# Patient Record
Sex: Female | Born: 1950 | Race: Black or African American | Hispanic: No | Smoking: Never smoker
Health system: Southern US, Community
[De-identification: ages and names within clinical notes are randomized; demographics above are authoritative.]

## PROBLEM LIST (undated history)

## (undated) DIAGNOSIS — G43909 Migraine, unspecified, not intractable, without status migrainosus: Secondary | ICD-10-CM

## (undated) DIAGNOSIS — R4020 Unspecified coma: Secondary | ICD-10-CM

## (undated) DIAGNOSIS — I10 Essential (primary) hypertension: Secondary | ICD-10-CM

## (undated) DIAGNOSIS — F32A Depression, unspecified: Secondary | ICD-10-CM

## (undated) DIAGNOSIS — F419 Anxiety disorder, unspecified: Secondary | ICD-10-CM

## (undated) HISTORY — PX: ABDOMINAL HYSTERECTOMY: SHX81

## (undated) HISTORY — DX: Unspecified coma: R40.20

---

## 2016-06-03 DIAGNOSIS — H5210 Myopia, unspecified eye: Secondary | ICD-10-CM | POA: Diagnosis not present

## 2016-06-03 DIAGNOSIS — H524 Presbyopia: Secondary | ICD-10-CM | POA: Diagnosis not present

## 2016-07-01 ENCOUNTER — Encounter (INDEPENDENT_AMBULATORY_CARE_PROVIDER_SITE_OTHER): Payer: Self-pay | Admitting: Ophthalmology

## 2017-02-23 DIAGNOSIS — H524 Presbyopia: Secondary | ICD-10-CM | POA: Diagnosis not present

## 2017-02-23 DIAGNOSIS — H5203 Hypermetropia, bilateral: Secondary | ICD-10-CM | POA: Diagnosis not present

## 2017-02-23 DIAGNOSIS — H52209 Unspecified astigmatism, unspecified eye: Secondary | ICD-10-CM | POA: Diagnosis not present

## 2017-02-27 ENCOUNTER — Other Ambulatory Visit: Payer: Self-pay | Admitting: Family Medicine

## 2017-02-27 ENCOUNTER — Other Ambulatory Visit (HOSPITAL_COMMUNITY)
Admission: RE | Admit: 2017-02-27 | Discharge: 2017-02-27 | Disposition: A | Discharge status: Home / Self Care | Payer: Medicare HMO | Source: Ambulatory Visit | Attending: Family Medicine | Admitting: Family Medicine

## 2017-02-27 DIAGNOSIS — L290 Pruritus ani: Secondary | ICD-10-CM | POA: Diagnosis not present

## 2017-02-27 DIAGNOSIS — R7303 Prediabetes: Secondary | ICD-10-CM | POA: Diagnosis not present

## 2017-02-27 DIAGNOSIS — Z0000 Encounter for general adult medical examination without abnormal findings: Secondary | ICD-10-CM | POA: Diagnosis not present

## 2017-02-27 DIAGNOSIS — E785 Hyperlipidemia, unspecified: Secondary | ICD-10-CM | POA: Diagnosis not present

## 2017-02-27 DIAGNOSIS — N6459 Other signs and symptoms in breast: Secondary | ICD-10-CM | POA: Diagnosis not present

## 2017-02-27 DIAGNOSIS — Z124 Encounter for screening for malignant neoplasm of cervix: Secondary | ICD-10-CM | POA: Insufficient documentation

## 2017-02-27 DIAGNOSIS — K219 Gastro-esophageal reflux disease without esophagitis: Secondary | ICD-10-CM | POA: Diagnosis not present

## 2017-02-27 DIAGNOSIS — F419 Anxiety disorder, unspecified: Secondary | ICD-10-CM | POA: Diagnosis not present

## 2017-02-27 DIAGNOSIS — I10 Essential (primary) hypertension: Secondary | ICD-10-CM | POA: Diagnosis not present

## 2017-02-27 DIAGNOSIS — Z23 Encounter for immunization: Secondary | ICD-10-CM | POA: Diagnosis not present

## 2017-03-01 LAB — CYTOLOGY - PAP: Diagnosis: NEGATIVE

## 2018-08-22 ENCOUNTER — Other Ambulatory Visit: Payer: Self-pay | Admitting: Family Medicine

## 2018-08-22 DIAGNOSIS — Z1231 Encounter for screening mammogram for malignant neoplasm of breast: Secondary | ICD-10-CM

## 2018-10-01 ENCOUNTER — Encounter: Payer: Self-pay | Admitting: Radiology

## 2018-10-01 ENCOUNTER — Ambulatory Visit
Admission: RE | Admit: 2018-10-01 | Discharge: 2018-10-01 | Disposition: A | Discharge status: Home / Self Care | Payer: Medicare Other | Source: Ambulatory Visit | Attending: Family Medicine | Admitting: Family Medicine

## 2018-10-01 DIAGNOSIS — Z1231 Encounter for screening mammogram for malignant neoplasm of breast: Secondary | ICD-10-CM

## 2020-05-28 ENCOUNTER — Other Ambulatory Visit: Payer: Self-pay | Admitting: Family Medicine

## 2020-05-28 DIAGNOSIS — Z1231 Encounter for screening mammogram for malignant neoplasm of breast: Secondary | ICD-10-CM

## 2020-05-28 DIAGNOSIS — E2839 Other primary ovarian failure: Secondary | ICD-10-CM

## 2020-08-26 ENCOUNTER — Other Ambulatory Visit: Payer: Self-pay

## 2020-08-26 ENCOUNTER — Ambulatory Visit
Admission: RE | Admit: 2020-08-26 | Discharge: 2020-08-26 | Disposition: A | Discharge status: Home / Self Care | Payer: Medicare Other | Source: Ambulatory Visit | Attending: Family Medicine | Admitting: Family Medicine

## 2020-08-26 DIAGNOSIS — E2839 Other primary ovarian failure: Secondary | ICD-10-CM

## 2020-08-26 DIAGNOSIS — Z1231 Encounter for screening mammogram for malignant neoplasm of breast: Secondary | ICD-10-CM

## 2020-11-27 DIAGNOSIS — R7303 Prediabetes: Secondary | ICD-10-CM | POA: Diagnosis not present

## 2020-11-27 DIAGNOSIS — R69 Illness, unspecified: Secondary | ICD-10-CM | POA: Diagnosis not present

## 2020-11-27 DIAGNOSIS — E7800 Pure hypercholesterolemia, unspecified: Secondary | ICD-10-CM | POA: Diagnosis not present

## 2020-11-27 DIAGNOSIS — I10 Essential (primary) hypertension: Secondary | ICD-10-CM | POA: Diagnosis not present

## 2020-12-09 DIAGNOSIS — Z20822 Contact with and (suspected) exposure to covid-19: Secondary | ICD-10-CM | POA: Diagnosis not present

## 2020-12-16 DIAGNOSIS — Z20822 Contact with and (suspected) exposure to covid-19: Secondary | ICD-10-CM | POA: Diagnosis not present

## 2020-12-22 DIAGNOSIS — Z20822 Contact with and (suspected) exposure to covid-19: Secondary | ICD-10-CM | POA: Diagnosis not present

## 2020-12-23 DIAGNOSIS — R69 Illness, unspecified: Secondary | ICD-10-CM | POA: Diagnosis not present

## 2020-12-23 DIAGNOSIS — H547 Unspecified visual loss: Secondary | ICD-10-CM | POA: Diagnosis not present

## 2020-12-23 DIAGNOSIS — K219 Gastro-esophageal reflux disease without esophagitis: Secondary | ICD-10-CM | POA: Diagnosis not present

## 2020-12-23 DIAGNOSIS — I10 Essential (primary) hypertension: Secondary | ICD-10-CM | POA: Diagnosis not present

## 2020-12-23 DIAGNOSIS — E785 Hyperlipidemia, unspecified: Secondary | ICD-10-CM | POA: Diagnosis not present

## 2020-12-23 DIAGNOSIS — F419 Anxiety disorder, unspecified: Secondary | ICD-10-CM | POA: Diagnosis not present

## 2020-12-23 DIAGNOSIS — Z6841 Body Mass Index (BMI) 40.0 and over, adult: Secondary | ICD-10-CM | POA: Diagnosis not present

## 2020-12-23 DIAGNOSIS — R32 Unspecified urinary incontinence: Secondary | ICD-10-CM | POA: Diagnosis not present

## 2020-12-29 DIAGNOSIS — Z20822 Contact with and (suspected) exposure to covid-19: Secondary | ICD-10-CM | POA: Diagnosis not present

## 2021-02-24 ENCOUNTER — Other Ambulatory Visit: Payer: Self-pay | Admitting: Gastroenterology

## 2021-02-24 DIAGNOSIS — R1013 Epigastric pain: Secondary | ICD-10-CM | POA: Diagnosis not present

## 2021-02-24 DIAGNOSIS — R1310 Dysphagia, unspecified: Secondary | ICD-10-CM | POA: Diagnosis not present

## 2021-02-24 DIAGNOSIS — Z86010 Personal history of colonic polyps: Secondary | ICD-10-CM | POA: Diagnosis not present

## 2021-03-17 ENCOUNTER — Other Ambulatory Visit: Payer: Medicare Other

## 2021-04-19 DIAGNOSIS — Z20822 Contact with and (suspected) exposure to covid-19: Secondary | ICD-10-CM | POA: Diagnosis not present

## 2021-04-20 DIAGNOSIS — Z20822 Contact with and (suspected) exposure to covid-19: Secondary | ICD-10-CM | POA: Diagnosis not present

## 2021-04-26 DIAGNOSIS — Z20822 Contact with and (suspected) exposure to covid-19: Secondary | ICD-10-CM | POA: Diagnosis not present

## 2021-05-03 DIAGNOSIS — G4700 Insomnia, unspecified: Secondary | ICD-10-CM | POA: Diagnosis not present

## 2021-05-03 DIAGNOSIS — I10 Essential (primary) hypertension: Secondary | ICD-10-CM | POA: Diagnosis not present

## 2021-05-03 DIAGNOSIS — K219 Gastro-esophageal reflux disease without esophagitis: Secondary | ICD-10-CM | POA: Diagnosis not present

## 2021-05-03 DIAGNOSIS — E7800 Pure hypercholesterolemia, unspecified: Secondary | ICD-10-CM | POA: Diagnosis not present

## 2021-05-04 DIAGNOSIS — Z20822 Contact with and (suspected) exposure to covid-19: Secondary | ICD-10-CM | POA: Diagnosis not present

## 2021-05-11 DIAGNOSIS — Z20822 Contact with and (suspected) exposure to covid-19: Secondary | ICD-10-CM | POA: Diagnosis not present

## 2021-05-20 DIAGNOSIS — Z20822 Contact with and (suspected) exposure to covid-19: Secondary | ICD-10-CM | POA: Diagnosis not present

## 2021-05-25 DIAGNOSIS — Z20822 Contact with and (suspected) exposure to covid-19: Secondary | ICD-10-CM | POA: Diagnosis not present

## 2021-05-28 DIAGNOSIS — N1831 Chronic kidney disease, stage 3a: Secondary | ICD-10-CM | POA: Diagnosis not present

## 2021-05-28 DIAGNOSIS — E7800 Pure hypercholesterolemia, unspecified: Secondary | ICD-10-CM | POA: Diagnosis not present

## 2021-05-28 DIAGNOSIS — G4700 Insomnia, unspecified: Secondary | ICD-10-CM | POA: Diagnosis not present

## 2021-05-28 DIAGNOSIS — I10 Essential (primary) hypertension: Secondary | ICD-10-CM | POA: Diagnosis not present

## 2021-05-28 DIAGNOSIS — K219 Gastro-esophageal reflux disease without esophagitis: Secondary | ICD-10-CM | POA: Diagnosis not present

## 2021-06-01 DIAGNOSIS — Z20822 Contact with and (suspected) exposure to covid-19: Secondary | ICD-10-CM | POA: Diagnosis not present

## 2021-06-07 DIAGNOSIS — M85859 Other specified disorders of bone density and structure, unspecified thigh: Secondary | ICD-10-CM | POA: Diagnosis not present

## 2021-06-07 DIAGNOSIS — H3552 Pigmentary retinal dystrophy: Secondary | ICD-10-CM | POA: Diagnosis not present

## 2021-06-07 DIAGNOSIS — K219 Gastro-esophageal reflux disease without esophagitis: Secondary | ICD-10-CM | POA: Diagnosis not present

## 2021-06-07 DIAGNOSIS — R32 Unspecified urinary incontinence: Secondary | ICD-10-CM | POA: Diagnosis not present

## 2021-06-07 DIAGNOSIS — R69 Illness, unspecified: Secondary | ICD-10-CM | POA: Diagnosis not present

## 2021-06-07 DIAGNOSIS — E7800 Pure hypercholesterolemia, unspecified: Secondary | ICD-10-CM | POA: Diagnosis not present

## 2021-06-07 DIAGNOSIS — Z0000 Encounter for general adult medical examination without abnormal findings: Secondary | ICD-10-CM | POA: Diagnosis not present

## 2021-06-07 DIAGNOSIS — I10 Essential (primary) hypertension: Secondary | ICD-10-CM | POA: Diagnosis not present

## 2021-06-07 DIAGNOSIS — L089 Local infection of the skin and subcutaneous tissue, unspecified: Secondary | ICD-10-CM | POA: Diagnosis not present

## 2021-06-07 DIAGNOSIS — Z86010 Personal history of colonic polyps: Secondary | ICD-10-CM | POA: Diagnosis not present

## 2021-06-07 DIAGNOSIS — Z1389 Encounter for screening for other disorder: Secondary | ICD-10-CM | POA: Diagnosis not present

## 2021-06-08 DIAGNOSIS — Z20822 Contact with and (suspected) exposure to covid-19: Secondary | ICD-10-CM | POA: Diagnosis not present

## 2021-06-14 DIAGNOSIS — K5730 Diverticulosis of large intestine without perforation or abscess without bleeding: Secondary | ICD-10-CM | POA: Diagnosis not present

## 2021-06-14 DIAGNOSIS — K2930 Chronic superficial gastritis without bleeding: Secondary | ICD-10-CM | POA: Diagnosis not present

## 2021-06-14 DIAGNOSIS — Z86010 Personal history of colonic polyps: Secondary | ICD-10-CM | POA: Diagnosis not present

## 2021-06-14 DIAGNOSIS — K3189 Other diseases of stomach and duodenum: Secondary | ICD-10-CM | POA: Diagnosis not present

## 2021-06-14 DIAGNOSIS — R1310 Dysphagia, unspecified: Secondary | ICD-10-CM | POA: Diagnosis not present

## 2021-06-14 DIAGNOSIS — D123 Benign neoplasm of transverse colon: Secondary | ICD-10-CM | POA: Diagnosis not present

## 2021-06-14 DIAGNOSIS — R1013 Epigastric pain: Secondary | ICD-10-CM | POA: Diagnosis not present

## 2021-06-14 DIAGNOSIS — K219 Gastro-esophageal reflux disease without esophagitis: Secondary | ICD-10-CM | POA: Diagnosis not present

## 2021-06-14 DIAGNOSIS — K317 Polyp of stomach and duodenum: Secondary | ICD-10-CM | POA: Diagnosis not present

## 2021-06-15 DIAGNOSIS — Z20822 Contact with and (suspected) exposure to covid-19: Secondary | ICD-10-CM | POA: Diagnosis not present

## 2021-06-18 DIAGNOSIS — D123 Benign neoplasm of transverse colon: Secondary | ICD-10-CM | POA: Diagnosis not present

## 2021-06-18 DIAGNOSIS — K219 Gastro-esophageal reflux disease without esophagitis: Secondary | ICD-10-CM | POA: Diagnosis not present

## 2021-06-18 DIAGNOSIS — K2930 Chronic superficial gastritis without bleeding: Secondary | ICD-10-CM | POA: Diagnosis not present

## 2021-06-18 DIAGNOSIS — K317 Polyp of stomach and duodenum: Secondary | ICD-10-CM | POA: Diagnosis not present

## 2021-06-23 DIAGNOSIS — Z20822 Contact with and (suspected) exposure to covid-19: Secondary | ICD-10-CM | POA: Diagnosis not present

## 2021-06-28 DIAGNOSIS — I10 Essential (primary) hypertension: Secondary | ICD-10-CM | POA: Diagnosis not present

## 2021-06-28 DIAGNOSIS — K219 Gastro-esophageal reflux disease without esophagitis: Secondary | ICD-10-CM | POA: Diagnosis not present

## 2021-06-28 DIAGNOSIS — G4700 Insomnia, unspecified: Secondary | ICD-10-CM | POA: Diagnosis not present

## 2021-06-28 DIAGNOSIS — E7800 Pure hypercholesterolemia, unspecified: Secondary | ICD-10-CM | POA: Diagnosis not present

## 2021-06-28 DIAGNOSIS — N1831 Chronic kidney disease, stage 3a: Secondary | ICD-10-CM | POA: Diagnosis not present

## 2021-08-04 DIAGNOSIS — Z1152 Encounter for screening for COVID-19: Secondary | ICD-10-CM | POA: Diagnosis not present

## 2021-08-12 DIAGNOSIS — Z1152 Encounter for screening for COVID-19: Secondary | ICD-10-CM | POA: Diagnosis not present

## 2021-08-13 DIAGNOSIS — N1831 Chronic kidney disease, stage 3a: Secondary | ICD-10-CM | POA: Diagnosis not present

## 2021-08-13 DIAGNOSIS — G4700 Insomnia, unspecified: Secondary | ICD-10-CM | POA: Diagnosis not present

## 2021-08-13 DIAGNOSIS — I10 Essential (primary) hypertension: Secondary | ICD-10-CM | POA: Diagnosis not present

## 2021-08-13 DIAGNOSIS — E7800 Pure hypercholesterolemia, unspecified: Secondary | ICD-10-CM | POA: Diagnosis not present

## 2021-08-13 DIAGNOSIS — K219 Gastro-esophageal reflux disease without esophagitis: Secondary | ICD-10-CM | POA: Diagnosis not present

## 2021-08-27 DIAGNOSIS — Z1152 Encounter for screening for COVID-19: Secondary | ICD-10-CM | POA: Diagnosis not present

## 2021-09-02 DIAGNOSIS — Z1152 Encounter for screening for COVID-19: Secondary | ICD-10-CM | POA: Diagnosis not present

## 2021-09-04 DIAGNOSIS — Z1152 Encounter for screening for COVID-19: Secondary | ICD-10-CM | POA: Diagnosis not present

## 2021-09-28 DIAGNOSIS — I10 Essential (primary) hypertension: Secondary | ICD-10-CM | POA: Diagnosis not present

## 2021-09-28 DIAGNOSIS — K219 Gastro-esophageal reflux disease without esophagitis: Secondary | ICD-10-CM | POA: Diagnosis not present

## 2021-09-28 DIAGNOSIS — G4700 Insomnia, unspecified: Secondary | ICD-10-CM | POA: Diagnosis not present

## 2021-09-28 DIAGNOSIS — N1831 Chronic kidney disease, stage 3a: Secondary | ICD-10-CM | POA: Diagnosis not present

## 2021-09-28 DIAGNOSIS — E7800 Pure hypercholesterolemia, unspecified: Secondary | ICD-10-CM | POA: Diagnosis not present

## 2021-11-02 DIAGNOSIS — M1991 Primary osteoarthritis, unspecified site: Secondary | ICD-10-CM | POA: Diagnosis not present

## 2021-11-02 DIAGNOSIS — Z0000 Encounter for general adult medical examination without abnormal findings: Secondary | ICD-10-CM | POA: Diagnosis not present

## 2021-11-02 DIAGNOSIS — R7303 Prediabetes: Secondary | ICD-10-CM | POA: Diagnosis not present

## 2021-11-02 DIAGNOSIS — R69 Illness, unspecified: Secondary | ICD-10-CM | POA: Diagnosis not present

## 2021-11-02 DIAGNOSIS — I10 Essential (primary) hypertension: Secondary | ICD-10-CM | POA: Diagnosis not present

## 2021-11-02 DIAGNOSIS — R32 Unspecified urinary incontinence: Secondary | ICD-10-CM | POA: Diagnosis not present

## 2021-11-05 DIAGNOSIS — S86911A Strain of unspecified muscle(s) and tendon(s) at lower leg level, right leg, initial encounter: Secondary | ICD-10-CM | POA: Diagnosis not present

## 2021-11-05 DIAGNOSIS — S86112A Strain of other muscle(s) and tendon(s) of posterior muscle group at lower leg level, left leg, initial encounter: Secondary | ICD-10-CM | POA: Diagnosis not present

## 2021-11-05 DIAGNOSIS — W19XXXA Unspecified fall, initial encounter: Secondary | ICD-10-CM | POA: Diagnosis not present

## 2021-11-05 DIAGNOSIS — M25561 Pain in right knee: Secondary | ICD-10-CM | POA: Diagnosis not present

## 2021-12-08 DIAGNOSIS — F419 Anxiety disorder, unspecified: Secondary | ICD-10-CM | POA: Diagnosis not present

## 2021-12-08 DIAGNOSIS — M79604 Pain in right leg: Secondary | ICD-10-CM | POA: Diagnosis not present

## 2021-12-08 DIAGNOSIS — I10 Essential (primary) hypertension: Secondary | ICD-10-CM | POA: Diagnosis not present

## 2021-12-08 DIAGNOSIS — K219 Gastro-esophageal reflux disease without esophagitis: Secondary | ICD-10-CM | POA: Diagnosis not present

## 2021-12-08 DIAGNOSIS — R7303 Prediabetes: Secondary | ICD-10-CM | POA: Diagnosis not present

## 2021-12-08 DIAGNOSIS — E7800 Pure hypercholesterolemia, unspecified: Secondary | ICD-10-CM | POA: Diagnosis not present

## 2021-12-14 DIAGNOSIS — I10 Essential (primary) hypertension: Secondary | ICD-10-CM | POA: Diagnosis not present

## 2021-12-16 DIAGNOSIS — M79605 Pain in left leg: Secondary | ICD-10-CM | POA: Diagnosis not present

## 2021-12-16 DIAGNOSIS — M25561 Pain in right knee: Secondary | ICD-10-CM | POA: Diagnosis not present

## 2021-12-16 DIAGNOSIS — M25551 Pain in right hip: Secondary | ICD-10-CM | POA: Diagnosis not present

## 2021-12-19 IMAGING — MG DIGITAL SCREENING BILAT W/ TOMO W/ CAD
5 series · 6 of 17 positions shown · non-contrast
Comparison: Previous exam(s).

CLINICAL DATA: Screening.

EXAM:
DIGITAL SCREENING BILATERAL MAMMOGRAM WITH TOMO AND CAD

[L MLO synth-2D]
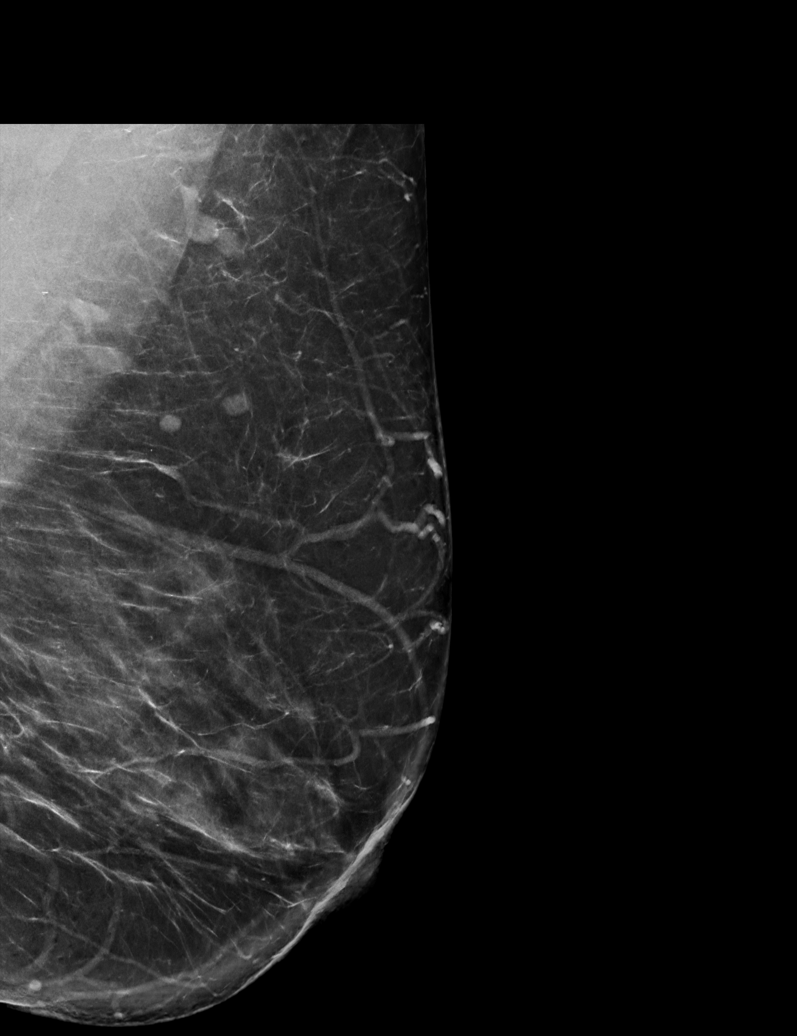

[L CC synth-2D]
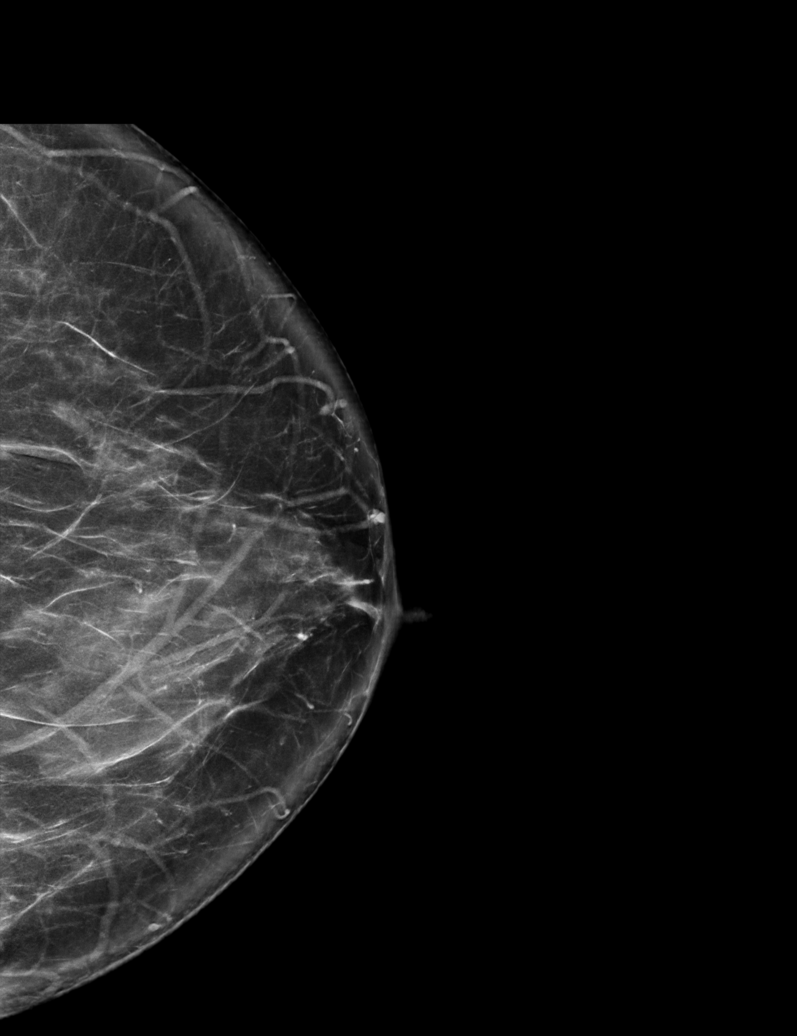

[L CC tomo · 2 of 75 frames shown]
[frame 25/75]
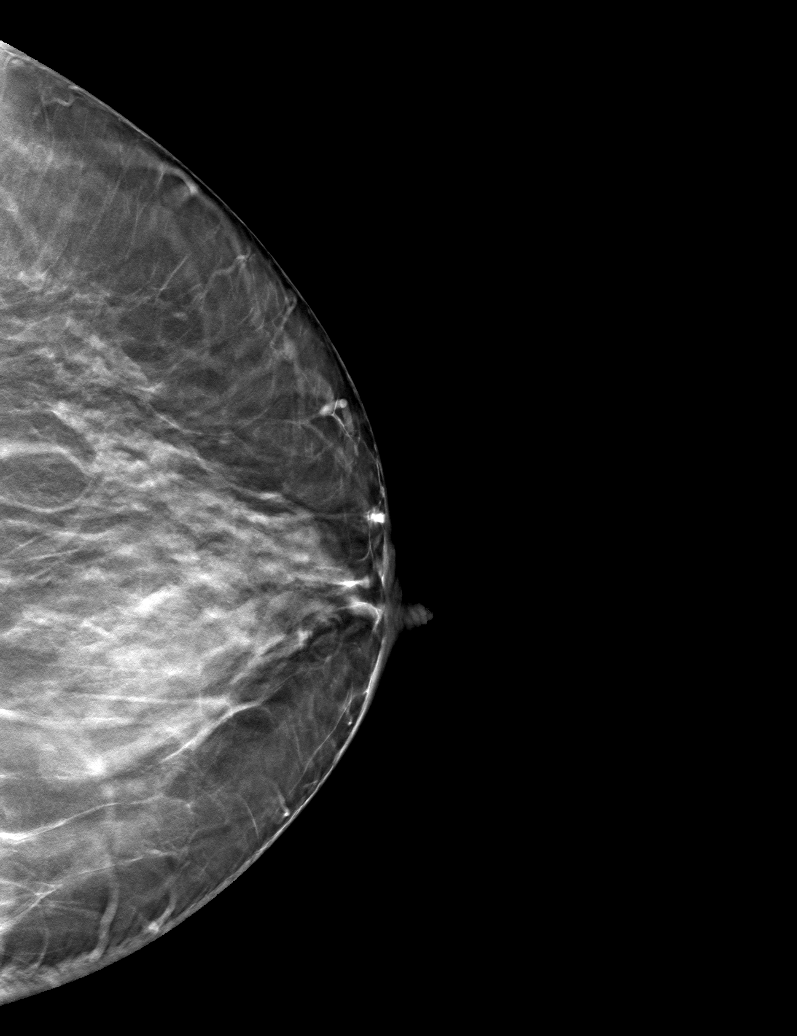
[frame 38/75]
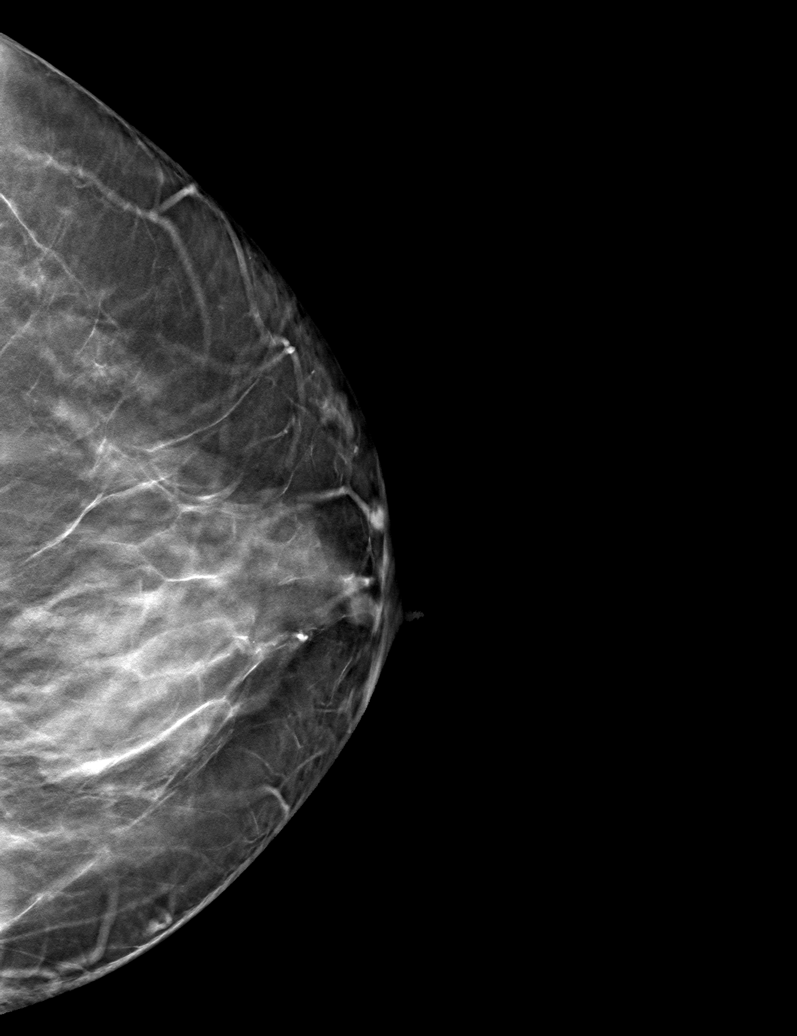

[R CC tomo · tomo slice 39/78.0]
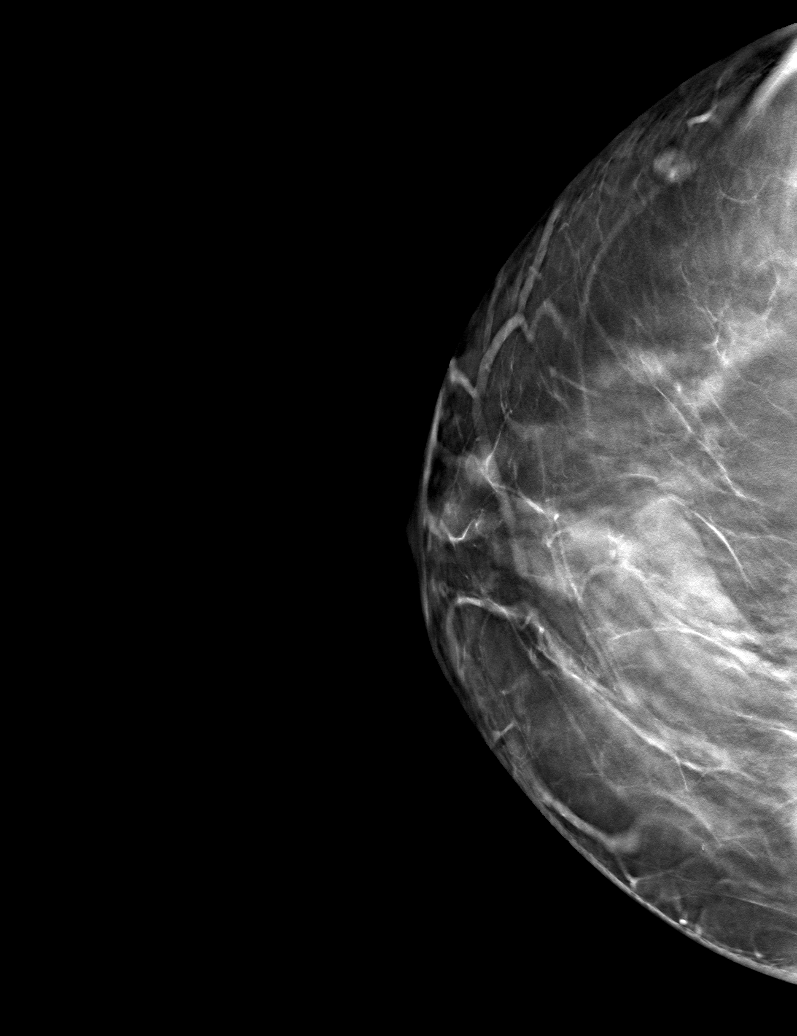

[R MLO tomo · tomo slice 43/84.0]
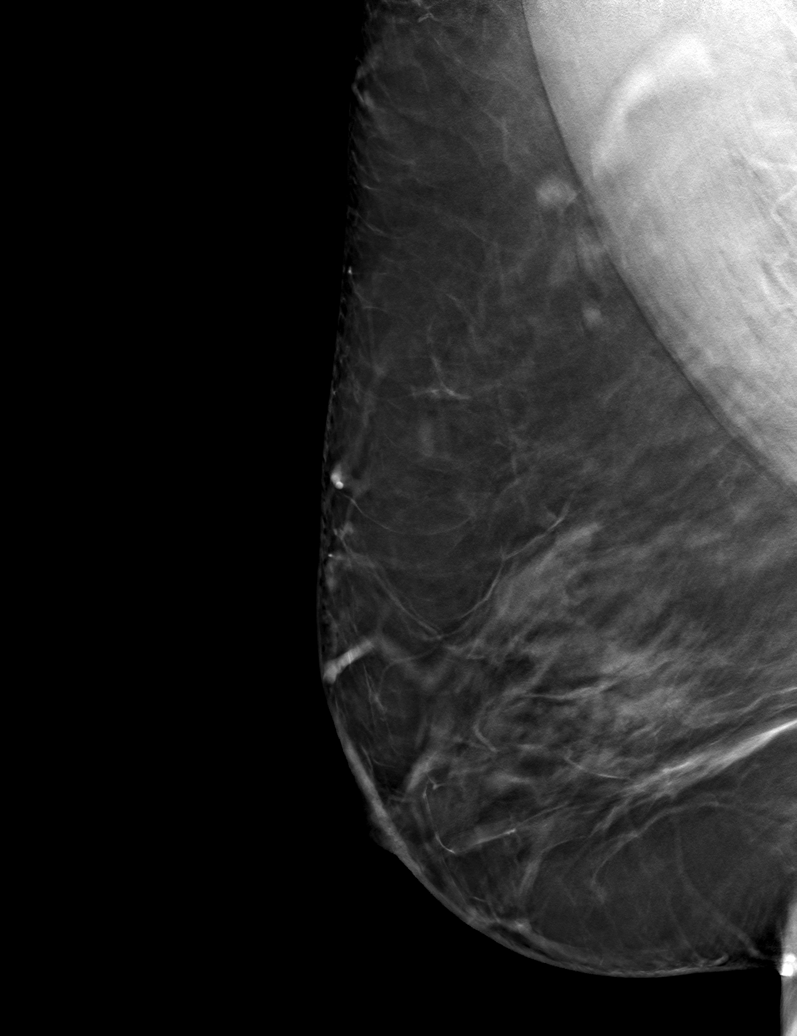

[6 of 17 positions shown; findings below may reference images not displayed]

ACR Breast Density Category c: The breast tissue is heterogeneously
dense, which may obscure small masses.
FINDINGS: There are no findings suspicious for malignancy. Images were
processed with CAD.
IMPRESSION: No mammographic evidence of malignancy. A result letter of this
screening mammogram will be mailed directly to the patient.

RECOMMENDATION:
Screening mammogram in one year. (Code:FT-U-LHB)

BI-RADS CATEGORY  1: Negative.

## 2021-12-26 DIAGNOSIS — M25561 Pain in right knee: Secondary | ICD-10-CM | POA: Diagnosis not present

## 2021-12-30 DIAGNOSIS — M25561 Pain in right knee: Secondary | ICD-10-CM | POA: Diagnosis not present

## 2022-03-04 DIAGNOSIS — R7303 Prediabetes: Secondary | ICD-10-CM | POA: Diagnosis not present

## 2022-04-06 DIAGNOSIS — R232 Flushing: Secondary | ICD-10-CM | POA: Diagnosis not present

## 2022-04-20 DIAGNOSIS — R7309 Other abnormal glucose: Secondary | ICD-10-CM | POA: Diagnosis not present

## 2022-05-06 DIAGNOSIS — R7309 Other abnormal glucose: Secondary | ICD-10-CM | POA: Diagnosis not present

## 2022-05-30 DIAGNOSIS — R7309 Other abnormal glucose: Secondary | ICD-10-CM | POA: Diagnosis not present

## 2022-06-13 ENCOUNTER — Emergency Department (HOSPITAL_COMMUNITY)
Admission: EM | Admit: 2022-06-13 | Discharge: 2022-06-13 | Disposition: A | Discharge status: Home / Self Care | Payer: Medicare Other | Attending: Emergency Medicine | Admitting: Emergency Medicine

## 2022-06-13 ENCOUNTER — Emergency Department (HOSPITAL_COMMUNITY): Payer: Medicare Other

## 2022-06-13 ENCOUNTER — Other Ambulatory Visit: Payer: Self-pay

## 2022-06-13 ENCOUNTER — Encounter (HOSPITAL_COMMUNITY): Payer: Self-pay

## 2022-06-13 DIAGNOSIS — Z79899 Other long term (current) drug therapy: Secondary | ICD-10-CM | POA: Insufficient documentation

## 2022-06-13 DIAGNOSIS — G43809 Other migraine, not intractable, without status migrainosus: Secondary | ICD-10-CM | POA: Insufficient documentation

## 2022-06-13 DIAGNOSIS — R42 Dizziness and giddiness: Secondary | ICD-10-CM | POA: Insufficient documentation

## 2022-06-13 DIAGNOSIS — H53149 Visual discomfort, unspecified: Secondary | ICD-10-CM | POA: Diagnosis not present

## 2022-06-13 DIAGNOSIS — I159 Secondary hypertension, unspecified: Secondary | ICD-10-CM | POA: Diagnosis not present

## 2022-06-13 DIAGNOSIS — R079 Chest pain, unspecified: Secondary | ICD-10-CM | POA: Diagnosis not present

## 2022-06-13 DIAGNOSIS — R519 Headache, unspecified: Secondary | ICD-10-CM

## 2022-06-13 DIAGNOSIS — H538 Other visual disturbances: Secondary | ICD-10-CM | POA: Insufficient documentation

## 2022-06-13 HISTORY — DX: Depression, unspecified: F32.A

## 2022-06-13 HISTORY — DX: Migraine, unspecified, not intractable, without status migrainosus: G43.909

## 2022-06-13 HISTORY — DX: Essential (primary) hypertension: I10

## 2022-06-13 HISTORY — DX: Anxiety disorder, unspecified: F41.9

## 2022-06-13 LAB — COMPREHENSIVE METABOLIC PANEL
ALT: 16 U/L (ref 0–44)
AST: 20 U/L (ref 15–41)
Albumin: 4.1 g/dL (ref 3.5–5.0)
Alkaline Phosphatase: 62 U/L (ref 38–126)
Anion gap: 7 (ref 5–15)
BUN: 14 mg/dL (ref 8–23)
CO2: 29 mmol/L (ref 22–32)
Calcium: 9.6 mg/dL (ref 8.9–10.3)
Chloride: 105 mmol/L (ref 98–111)
Creatinine, Ser: 1.11 mg/dL — ABNORMAL HIGH (ref 0.44–1.00)
GFR, Estimated: 53 mL/min — ABNORMAL LOW (ref >60)
Glucose, Bld: 87 mg/dL (ref 70–99)
Potassium: 4 mmol/L (ref 3.5–5.1)
Sodium: 141 mmol/L (ref 135–145)
Total Bilirubin: 0.5 mg/dL (ref 0.3–1.2)
Total Protein: 8.1 g/dL (ref 6.5–8.1)

## 2022-06-13 LAB — CBC WITH DIFFERENTIAL/PLATELET
Abs Immature Granulocytes: 0.01 10*3/uL (ref 0.00–0.07)
Basophils Absolute: 0 10*3/uL (ref 0.0–0.1)
Basophils Relative: 1 %
Eosinophils Absolute: 0.2 10*3/uL (ref 0.0–0.5)
Eosinophils Relative: 2 %
HCT: 44.1 % (ref 36.0–46.0)
Hemoglobin: 14.6 g/dL (ref 12.0–15.0)
Immature Granulocytes: 0 %
Lymphocytes Relative: 50 %
Lymphs Abs: 3.6 10*3/uL (ref 0.7–4.0)
MCH: 31.3 pg (ref 26.0–34.0)
MCHC: 33.1 g/dL (ref 30.0–36.0)
MCV: 94.4 fL (ref 80.0–100.0)
Monocytes Absolute: 0.6 10*3/uL (ref 0.1–1.0)
Monocytes Relative: 8 %
Neutro Abs: 2.9 10*3/uL (ref 1.7–7.7)
Neutrophils Relative %: 39 %
Platelets: 282 10*3/uL (ref 150–400)
RBC: 4.67 MIL/uL (ref 3.87–5.11)
RDW: 12.8 % (ref 11.5–15.5)
WBC: 7.3 10*3/uL (ref 4.0–10.5)
nRBC: 0 % (ref 0.0–0.2)

## 2022-06-13 LAB — URINALYSIS, ROUTINE W REFLEX MICROSCOPIC
Bilirubin Urine: NEGATIVE
Glucose, UA: NEGATIVE mg/dL
Hgb urine dipstick: NEGATIVE
Ketones, ur: NEGATIVE mg/dL
Leukocytes,Ua: NEGATIVE
Nitrite: NEGATIVE
Protein, ur: NEGATIVE mg/dL
Specific Gravity, Urine: 1.01 (ref 1.005–1.030)
pH: 7 (ref 5.0–8.0)

## 2022-06-13 LAB — TROPONIN I (HIGH SENSITIVITY): Troponin I (High Sensitivity): 4 ng/L (ref <18)

## 2022-06-13 MED ORDER — DEXAMETHASONE SODIUM PHOSPHATE 10 MG/ML IJ SOLN
10.0000 mg | Freq: Once | INTRAMUSCULAR | Status: AC
  Administered 2022-06-13: 10 mg via INTRAVENOUS
  Filled 2022-06-13: qty 1

## 2022-06-13 MED ORDER — KETOROLAC TROMETHAMINE 30 MG/ML IJ SOLN
15.0000 mg | Freq: Once | INTRAMUSCULAR | Status: AC
  Administered 2022-06-13: 15 mg via INTRAVENOUS
  Filled 2022-06-13: qty 1

## 2022-06-13 MED ORDER — LACTATED RINGERS BOLUS PEDS
1000.0000 mL | Freq: Once | INTRAVENOUS | Status: AC
  Administered 2022-06-13: 1000 mL via INTRAVENOUS

## 2022-06-13 MED ORDER — PROCHLORPERAZINE EDISYLATE 10 MG/2ML IJ SOLN
5.0000 mg | Freq: Once | INTRAMUSCULAR | Status: AC
Start: 2022-06-13 — End: 2022-06-13
  Administered 2022-06-13: 5 mg via INTRAVENOUS
  Filled 2022-06-13: qty 2

## 2022-06-13 NOTE — ED Triage Notes (Signed)
Patient reports that she has had a headache x 3 weeks. Patient also c/o blurred vision and dizziness.  Patient states she has a history of migraines but has been 30 + years since her last migraine.

## 2022-06-13 NOTE — ED Provider Notes (Signed)
[facility] DEPT Provider Note   CSN: [id number] Arrival date & time: 06/13/22  1520     History  Chief Complaint  Patient presents with   Headache   Dizziness   Blurred Vision    Sally Harris is a 71 y.o. female. With pmh HTN, migraines, depression presenting with headache and dizziness.   Headache Associated symptoms: dizziness   Dizziness Associated symptoms: headaches        Home Medications Prior to Admission medications   Not on File      Allergies    Patient has no known allergies.    Review of Systems   Review of Systems  Neurological:  Positive for dizziness and headaches.    Physical Exam Updated Vital Signs BP (!) 165/94   Pulse 62   Temp 98.4 F (36.9 C) (Oral)   Resp 12   Ht '5\' 2"'$  (1.575 m)   Wt 108 kg   SpO2 100%   BMI 43.53 kg/m  Physical Exam Constitutional: Alert and oriented. Well appearing and in no distress. Eyes: Conjunctivae are normal. ENT      Head: Normocephalic and atraumatic.      Nose: No congestion.      Mouth/Throat: Mucous membranes are moist.      Neck: No stridor. Cardiovascular: S1, S2,  Normal and symmetric distal pulses are present in all extremities.Warm and well perfused. Respiratory: Normal respiratory effort. Breath sounds are normal. Gastrointestinal: Soft and nontender.  Musculoskeletal: Normal range of motion in all extremities.      Right lower leg: No tenderness or edema.      Left lower leg: No tenderness or edema. Neurologic: Normal speech and language without aphasia. AAOx4. PERRL. EOMI without nystagmus. Face symmetrical without droop. CN II-XII intact. Normal facial sensation. Tongue midline. No pronator drift. 5/5 strength in upper and lower extremities.  Normal sensation to light touch in all extremities. Normal finger-to-nose. Normal gait.  No focal neurologic deficits are appreciated. Skin: Skin is warm, dry and intact. No rash noted. Psychiatric: Mood and affect  are normal. Speech and behavior are normal.   ED Results / Procedures / Treatments   Labs (all labs ordered are listed, but only abnormal results are displayed) Labs Reviewed  URINALYSIS, ROUTINE W REFLEX MICROSCOPIC - Abnormal; Notable for the following components:      Result Value   Color, Urine STRAW (*)    All other components within normal limits  COMPREHENSIVE METABOLIC PANEL - Abnormal; Notable for the following components:   Creatinine, Ser 1.11 (*)    GFR, Estimated 53 (*)    All other components within normal limits  CBC WITH DIFFERENTIAL/PLATELET  TROPONIN I (HIGH SENSITIVITY)  TROPONIN I (HIGH SENSITIVITY)    EKG None  Radiology DG Chest 2 View  Result Date: 06/13/2022 CLINICAL DATA:  Intermittent chest pain. Additional history provided: Patient reports headache, blurred vision, dizziness. EXAM: CHEST - 2 VIEW COMPARISON:  No pertinent prior exams available for comparison. FINDINGS: Heart size within normal limits. No appreciable airspace consolidation or pulmonary edema. No evidence of pleural effusion or pneumothorax. No acute bony abnormality identified. Degenerative changes of the spine. IMPRESSION: No evidence of acute cardiopulmonary abnormality. Electronically Signed   By: Kellie Simmering D.O.   On: 06/13/2022 17:09   CT HEAD WO CONTRAST (5MM)  Result Date: 06/13/2022 CLINICAL DATA:  Headache. EXAM: CT HEAD WITHOUT CONTRAST TECHNIQUE: Contiguous axial images were obtained from the base of the skull through the vertex without intravenous  contrast. RADIATION DOSE REDUCTION: This exam was performed according to the departmental dose-optimization program which includes automated exposure control, adjustment of the mA and/or kV according to patient size and/or use of iterative reconstruction technique. COMPARISON:  None Available. FINDINGS: Brain: No evidence of acute infarction, hemorrhage, hydrocephalus, extra-axial collection or mass lesion/mass effect. Vascular: No  hyperdense vessel or unexpected calcification. Skull: Normal. Negative for fracture or focal lesion. Sinuses/Orbits: No acute finding. Other: None. IMPRESSION: No acute intracranial abnormality seen. Electronically Signed   By: Marijo Conception M.D.   On: 06/13/2022 16:48    Procedures Procedures    Medications Ordered in ED Medications - No data to display  ED Course/ Medical Decision Making/ A&P                           Medical Decision Making Regarding the patient's headache, I suspect it is most consistent with primary headache in nature such as tension or migraine headache. I do not think CVA as the patient has no focal neurologic deficits. Additionally, I do not think SAH as the patient did not have severe onset of pain at beginning of headache, they do not have focal neurologic deficits, and are overall non-toxic in appearance. I do not suspect meningitis with no illness symptoms, no rash, nontoxic appearance, and no meningismus on exam.  Additionally, patient had CT head today with no acute intracranial abnormalities.  Treating patient as symptomatic migraine with IV fluids, Toradol, Compazine and Decadron.  Will refer to neurology for outpatient follow-up.  Patient found to have elevated blood pressures > 160 SBP throughout their ED treatment period.  No evidence of progressive target organ dysfunction to suggest hypertensive emergency and acute lowering of blood pressure at this point.  Specifically,   - No evidence of acute myocardial ischemia based on EKG, troponin, absent chest pain - No evidence of acute renal failure based on reassuring creatinine and still able to produce urine with no acute electrolyte abnormalities - No evidence of pulmonary edema based on pulmonary exam  and chest XR - No evidence of encephalopathy based on normal and stable mental status - No evidence for [facility], ICH, or other CVA based on normal neurologic exam and normal CTH - No evidence for aortic  dissection based absent chest pain, normal EKG and CXR, symmetric radial pulses  Will manage as asymptomatic hypertensive urgency.  Suspect it is elevated also in the setting of headache, improved with treatment of headache with IV fluids and pain medications.  Advise close follow-up with PCP, currently on lisinopril-HCTZ.  Discussed tricked return precautions and advised taking another half pill if still having persistent elevated blood pressures at home despite having no pain and feeling relaxed.  Patient is in agreement with this plan and safe for discharge home and follow-up in outpatient setting.  Risk Prescription drug management.   ***   Final Clinical Impression(s) / ED Diagnoses Final diagnoses:  None    Rx / DC Orders ED Discharge Orders     None

## 2022-06-13 NOTE — Discharge Instructions (Signed)
You have been seen in the Emergency Department (ED) for a headache.  The CT scan of your head today was reassuring and showed no abnormalities as well as your EKG, labs and chest x-ray.  Take Tylenol 1 g every 6-8 hours and ibuprofen 400 mg every 4-6 hours as needed for pain.  You also need to drink PLENTY of water.  You should be called in 3 business days to schedule an appointment with neurology regarding your chronic headaches and migraines.  If they do not call you in the next 3 days call the phone number listed in your discharge paperwork.  Continue to monitor your blood pressure, if you feel like it is still elevated despite having no pain and feeling well rested and having no stress, you can take 1/2 pill of your blood pressure medication as needed.  As we have discussed, please follow up with your primary care doctor as soon as possible, ideally within one week, regarding today's ED visit and your headache symptoms.    Call your doctor or return to the ED if you have a worsening headache, sudden and severe headache, confusion, slurred speech, facial droop, fainting spells, weakness or numbness in any arm or leg, extreme fatigue, or other symptoms that concern you.

## 2022-06-13 NOTE — ED Provider Triage Note (Signed)
Emergency Medicine Provider Triage Evaluation Note  Sally Harris , a 71 y.o. female  was evaluated in triage.  Pt complains of intermittent headache over the past three weeks.   When she has a headache she feels dizzy, has right sided nasal congestion.   She feels both like she is going to pass out and off balance all the time but its worse when her headache is present.    Headache starts on left side.   No shortness of breath,  intermittent chest pain.   No headache currently.   She states this afternoon her headache will last for about 10 minutes multiple times.   In the past 24 hours she hasn't had any new significant changes, is more concerned that these symptoms are still ongoing.   She reports that at home her Bps run 130s/80s.    Physical Exam  BP (!) 184/104 (BP Location: Right Arm)   Pulse 73   Temp 98.5 F (36.9 C) (Oral)   Resp 16   Ht '5\' 2"'$  (1.575 m)   Wt 108 kg   SpO2 97%   BMI 43.53 kg/m  Gen:   Awake, no distress   Resp:  Normal effort  MSK:   Moves extremities without difficulty  Other:  Normal speech, moves all extremities with out difficulty.   Medical Decision Making  Medically screening exam initiated at 3:55 PM.  Appropriate orders placed.  Rashawn Rolon was informed that the remainder of the evaluation will be completed by another provider, this initial triage assessment does not replace that evaluation, and the importance of remaining in the ED until their evaluation is complete.     Lorin Glass, [place] 06/13/22 1606

## 2022-06-13 NOTE — ED Notes (Signed)
Pt given specimen cup and asked to provide urine sample.

## 2022-06-14 LAB — TROPONIN I (HIGH SENSITIVITY): Troponin I (High Sensitivity): 4 ng/L (ref <18)

## 2022-06-27 DIAGNOSIS — K219 Gastro-esophageal reflux disease without esophagitis: Secondary | ICD-10-CM | POA: Diagnosis not present

## 2022-06-27 DIAGNOSIS — I10 Essential (primary) hypertension: Secondary | ICD-10-CM | POA: Diagnosis not present

## 2022-06-27 DIAGNOSIS — M85859 Other specified disorders of bone density and structure, unspecified thigh: Secondary | ICD-10-CM | POA: Diagnosis not present

## 2022-06-27 DIAGNOSIS — H3552 Pigmentary retinal dystrophy: Secondary | ICD-10-CM | POA: Diagnosis not present

## 2022-06-27 DIAGNOSIS — N1831 Chronic kidney disease, stage 3a: Secondary | ICD-10-CM | POA: Diagnosis not present

## 2022-06-27 DIAGNOSIS — Z0000 Encounter for general adult medical examination without abnormal findings: Secondary | ICD-10-CM | POA: Diagnosis not present

## 2022-06-27 DIAGNOSIS — E7800 Pure hypercholesterolemia, unspecified: Secondary | ICD-10-CM | POA: Diagnosis not present

## 2022-07-12 DIAGNOSIS — R5383 Other fatigue: Secondary | ICD-10-CM | POA: Diagnosis not present

## 2022-07-14 DIAGNOSIS — M2550 Pain in unspecified joint: Secondary | ICD-10-CM | POA: Diagnosis not present

## 2022-07-14 DIAGNOSIS — R5383 Other fatigue: Secondary | ICD-10-CM | POA: Diagnosis not present

## 2022-07-15 ENCOUNTER — Ambulatory Visit (INDEPENDENT_AMBULATORY_CARE_PROVIDER_SITE_OTHER): Payer: Medicare Other | Admitting: Neurology

## 2022-07-15 ENCOUNTER — Encounter: Payer: Self-pay | Admitting: Neurology

## 2022-07-15 VITALS — BP 156/94 | HR 72 | Ht 62.0 in | Wt 234.0 lb

## 2022-07-15 DIAGNOSIS — G43001 Migraine without aura, not intractable, with status migrainosus: Secondary | ICD-10-CM

## 2022-07-15 DIAGNOSIS — I10 Essential (primary) hypertension: Secondary | ICD-10-CM | POA: Diagnosis not present

## 2022-07-15 DIAGNOSIS — W19XXXA Unspecified fall, initial encounter: Secondary | ICD-10-CM | POA: Diagnosis not present

## 2022-07-15 DIAGNOSIS — R7989 Other specified abnormal findings of blood chemistry: Secondary | ICD-10-CM

## 2022-07-15 DIAGNOSIS — H539 Unspecified visual disturbance: Secondary | ICD-10-CM

## 2022-07-15 DIAGNOSIS — R270 Ataxia, unspecified: Secondary | ICD-10-CM | POA: Diagnosis not present

## 2022-07-15 DIAGNOSIS — R2689 Other abnormalities of gait and mobility: Secondary | ICD-10-CM

## 2022-07-15 DIAGNOSIS — M5412 Radiculopathy, cervical region: Secondary | ICD-10-CM | POA: Diagnosis not present

## 2022-07-15 DIAGNOSIS — H538 Other visual disturbances: Secondary | ICD-10-CM | POA: Diagnosis not present

## 2022-07-15 DIAGNOSIS — R42 Dizziness and giddiness: Secondary | ICD-10-CM | POA: Diagnosis not present

## 2022-07-15 DIAGNOSIS — R519 Headache, unspecified: Secondary | ICD-10-CM

## 2022-07-15 DIAGNOSIS — R202 Paresthesia of skin: Secondary | ICD-10-CM | POA: Diagnosis not present

## 2022-07-15 MED ORDER — GABAPENTIN 100 MG PO CAPS: 100.0000 mg | ORAL_CAPSULE | Freq: Three times a day (TID) | ORAL | 3 refills | Status: AC | PRN
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
## 2022-07-15 NOTE — Patient Instructions (Addendum)
MRI of the brain and cervical spine  One lab to check kidneys Gabapentin up to 3x a day. Can take it as needed for headache/tingling or cam just take it 2-3x a day regularly.  Gabapentin Capsules or Tablets What is this medication? GABAPENTIN (GA ba pen tin) treats nerve pain. It may also be used to prevent and control seizures in people with epilepsy. It works by calming overactive nerves in your body. This medicine may be used for other purposes; ask your health care provider or pharmacist if you have questions. COMMON BRAND NAME(S): Active-PAC with Gabapentin, Orpha Bur, Gralise, Neurontin What should I tell my care team before I take this medication? They need to know if you have any of these conditions: Alcohol or substance use disorder Kidney disease Lung or breathing disease Suicidal thoughts, plans, or attempt; a previous suicide attempt by you or a family member An unusual or allergic reaction to gabapentin, other medications, foods, dyes, or preservatives Pregnant or trying to get pregnant Breast-feeding How should I use this medication? Take this medication by mouth with a glass of water. Follow the directions on the prescription label. You can take it with or without food. If it upsets your stomach, take it with food. Take your medication at regular intervals. Do not take it more often than directed. Do not stop taking except on your care team's advice. If you are directed to break the 600 or 800 mg tablets in half as part of your dose, the extra half tablet should be used for the next dose. If you have not used the extra half tablet within 28 days, it should be thrown away. A special MedGuide will be given to you by the pharmacist with each prescription and refill. Be sure to read this information carefully each time. Talk to your care team about the use of this medication in children. While this medication may be prescribed for children as young as 3 years for selected conditions,  precautions do apply. Overdosage: If you think you have taken too much of this medicine contact a poison control center or emergency room at once. NOTE: This medicine is only for you. Do not share this medicine with others. What if I miss a dose? If you miss a dose, take it as soon as you can. If it is almost time for your next dose, take only that dose. Do not take double or extra doses. What may interact with this medication? Alcohol Antihistamines for allergy, cough, and cold Certain medications for anxiety or sleep Certain medications for depression like amitriptyline, fluoxetine, sertraline Certain medications for seizures like phenobarbital, primidone Certain medications for stomach problems General anesthetics like halothane, isoflurane, methoxyflurane, propofol Local anesthetics like lidocaine, pramoxine, tetracaine Medications that relax muscles for surgery Opioid medications for pain Phenothiazines like chlorpromazine, mesoridazine, prochlorperazine, thioridazine This list may not describe all possible interactions. Give your health care provider a list of all the medicines, herbs, non-prescription drugs, or dietary supplements you use. Also tell them if you smoke, drink alcohol, or use illegal drugs. Some items may interact with your medicine. What should I watch for while using this medication? Visit your care team for regular checks on your progress. You may want to keep a record at home of how you feel your condition is responding to treatment. You may want to share this information with your care team at each visit. You should contact your care team if your seizures get worse or if you have any new types of  seizures. Do not stop taking this medication or any of your seizure medications unless instructed by your care team. Stopping your medication suddenly can increase your seizures or their severity. This medication may cause serious skin reactions. They can happen weeks to months  after starting the medication. Contact your care team right away if you notice fevers or flu-like symptoms with a rash. The rash may be red or purple and then turn into blisters or peeling of the skin. Or, you might notice a red rash with swelling of the face, lips or lymph nodes in your neck or under your arms. Wear a medical identification bracelet or chain if you are taking this medication for seizures. Carry a card that lists all your medications. This medication may affect your coordination, reaction time, or judgment. Do not drive or operate machinery until you know how this medication affects you. Sit up or stand slowly to reduce the risk of dizzy or fainting spells. Drinking alcohol with this medication can increase the risk of these side effects. Your mouth may get dry. Chewing sugarless gum or sucking hard candy, and drinking plenty of water may help. Watch for new or worsening thoughts of suicide or depression. This includes sudden changes in mood, behaviors, or thoughts. These changes can happen at any time but are more common in the beginning of treatment or after a change in dose. Call your care team right away if you experience these thoughts or worsening depression. If you become pregnant while using this medication, you may enroll in the [place] Pregnancy Registry by calling [phone]. This registry collects information about the safety of antiepileptic medication use during pregnancy. What side effects may I notice from receiving this medication? Side effects that you should report to your care team as soon as possible: Allergic reactions or angioedema--skin rash, itching, hives, swelling of the face, eyes, lips, tongue, arms, or legs, trouble swallowing or breathing Rash, fever, and swollen lymph nodes Thoughts of suicide or self harm, worsening mood, feelings of depression Trouble breathing Unusual changes in mood or behavior in children after use such  as difficulty concentrating, hostility, or restlessness Side effects that usually do not require medical attention (report to your care team if they continue or are bothersome): Dizziness Drowsiness Nausea Swelling of ankles, feet, or hands Vomiting This list may not describe all possible side effects. Call your doctor for medical advice about side effects. You may report side effects to FDA at 1-800-FDA-1088. Where should I keep my medication? Keep out of reach of children and pets. Store at room temperature between 15 and 30 degrees C (59 and 86 degrees F). Get rid of any unused medication after the expiration date. This medication may cause accidental overdose and death if taken by other adults, children, or pets. To get rid of medications that are no longer needed or have expired: Take the medication to a medication take-back program. Check with your pharmacy or law enforcement to find a location. If you cannot return the medication, check the label or package insert to see if the medication should be thrown out in the garbage or flushed down the toilet. If you are not sure, ask your care team. If it is safe to put it in the trash, empty the medication out of the container. Mix the medication with cat litter, dirt, coffee grounds, or other unwanted substance. Seal the mixture in a bag or container. Put it in the trash. NOTE: This sheet is a summary.  It may not cover all possible information. If you have questions about this medicine, talk to your doctor, pharmacist, or health care provider.  2023 Elsevier/Gold Standard (2020-11-03 00:00:00)

## 2022-07-16 ENCOUNTER — Encounter: Payer: Self-pay | Admitting: Neurology

## 2022-07-16 DIAGNOSIS — G43001 Migraine without aura, not intractable, with status migrainosus: Secondary | ICD-10-CM | POA: Insufficient documentation

## 2022-07-16 DIAGNOSIS — M5412 Radiculopathy, cervical region: Secondary | ICD-10-CM | POA: Insufficient documentation

## 2022-07-16 DIAGNOSIS — R7989 Other specified abnormal findings of blood chemistry: Secondary | ICD-10-CM | POA: Insufficient documentation

## 2022-07-16 DIAGNOSIS — R519 Headache, unspecified: Secondary | ICD-10-CM | POA: Insufficient documentation

## 2022-07-16 DIAGNOSIS — W19XXXA Unspecified fall, initial encounter: Secondary | ICD-10-CM | POA: Insufficient documentation

## 2022-07-16 DIAGNOSIS — I10 Essential (primary) hypertension: Secondary | ICD-10-CM | POA: Insufficient documentation

## 2022-07-16 DIAGNOSIS — R2689 Other abnormalities of gait and mobility: Secondary | ICD-10-CM | POA: Insufficient documentation

## 2022-07-16 LAB — BASIC METABOLIC PANEL
BUN/Creatinine Ratio: 18 (ref 12–28)
BUN: 20 mg/dL (ref 8–27)
CO2: 26 mmol/L (ref 20–29)
Calcium: 10 mg/dL (ref 8.7–10.3)
Chloride: 100 mmol/L (ref 96–106)
Creatinine, Ser: 1.11 mg/dL — ABNORMAL HIGH (ref 0.57–1.00)
Glucose: 114 mg/dL — ABNORMAL HIGH (ref 70–99)
Potassium: 4.3 mmol/L (ref 3.5–5.2)
Sodium: 139 mmol/L (ref 134–144)
eGFR: 53 mL/min/{1.73_m2} — ABNORMAL LOW (ref >59)

## 2022-07-19 DIAGNOSIS — H43813 Vitreous degeneration, bilateral: Secondary | ICD-10-CM | POA: Diagnosis not present

## 2022-07-19 DIAGNOSIS — H3552 Pigmentary retinal dystrophy: Secondary | ICD-10-CM | POA: Diagnosis not present

## 2022-07-19 DIAGNOSIS — H35353 Cystoid macular degeneration, bilateral: Secondary | ICD-10-CM | POA: Diagnosis not present

## 2022-07-19 DIAGNOSIS — H2513 Age-related nuclear cataract, bilateral: Secondary | ICD-10-CM | POA: Diagnosis not present

## 2022-07-20 ENCOUNTER — Telehealth: Payer: Self-pay | Admitting: Neurology

## 2022-07-20 NOTE — Telephone Encounter (Signed)
[employer] medicare NPR sent to GI 

## 2022-08-03 ENCOUNTER — Ambulatory Visit
Admission: RE | Admit: 2022-08-03 | Discharge: 2022-08-03 | Disposition: A | Discharge status: Home / Self Care | Payer: Medicare Other | Source: Ambulatory Visit | Attending: Neurology | Admitting: Neurology

## 2022-08-03 DIAGNOSIS — W19XXXA Unspecified fall, initial encounter: Secondary | ICD-10-CM

## 2022-08-03 DIAGNOSIS — R2689 Other abnormalities of gait and mobility: Secondary | ICD-10-CM

## 2022-08-03 DIAGNOSIS — H539 Unspecified visual disturbance: Secondary | ICD-10-CM

## 2022-08-03 DIAGNOSIS — R270 Ataxia, unspecified: Secondary | ICD-10-CM

## 2022-08-03 DIAGNOSIS — R519 Headache, unspecified: Secondary | ICD-10-CM | POA: Diagnosis not present

## 2022-08-03 DIAGNOSIS — R202 Paresthesia of skin: Secondary | ICD-10-CM

## 2022-08-03 DIAGNOSIS — H538 Other visual disturbances: Secondary | ICD-10-CM

## 2022-08-03 DIAGNOSIS — M5412 Radiculopathy, cervical region: Secondary | ICD-10-CM | POA: Diagnosis not present

## 2022-08-03 DIAGNOSIS — R42 Dizziness and giddiness: Secondary | ICD-10-CM

## 2022-08-03 MED ORDER — GADOBENATE DIMEGLUMINE 529 MG/ML IV SOLN
20.0000 mL | Freq: Once | INTRAVENOUS | Status: AC | PRN
  Administered 2022-08-03: 20 mL via INTRAVENOUS

## 2022-08-16 ENCOUNTER — Telehealth: Payer: Self-pay

## 2022-08-16 NOTE — Telephone Encounter (Signed)
-----   Message from Melvenia Beam, MD sent at 08/14/2022 12:11 PM EDT ----- Patient did not look at his mychart results will you call and give them to her? Thank you! [name]: The MRI of your brain was largely unremarkable, normal for age, we saw some "T2 flair hyperintense foci" which is often very common in aging and also seen in people with migraines, other things that can cause these changes other than normal aging is diabetes, smoking, high blood pressure, cholesterol so I tell people to just try and manage those as best they can but again these changes can be seen in common aging I likened them to wrinkles, everyone has them at some point.  I think that the other findings are incidental.  We can discuss at next appointment and see how you are doing on the gabapentin, if you feel as though you are not doing well please call the office and schedule a sooner appointment than December.  But nothing significantly wrong was seen with the brain, normal for aging brain.  Thanks Dr. Lavell Anchors.

## 2022-08-16 NOTE — Telephone Encounter (Signed)
I called patient.  I discussed her MRI brain results and recommendations.  Patient reports that she is doing well with the gabapentin.  She will call us if she would like a sooner appointment in December.  Patient verbalized understanding of results and had no further questions or concerns at this time.

## 2022-08-17 ENCOUNTER — Other Ambulatory Visit: Payer: Self-pay | Admitting: Family Medicine

## 2022-08-17 DIAGNOSIS — Z1231 Encounter for screening mammogram for malignant neoplasm of breast: Secondary | ICD-10-CM

## 2022-09-10 ENCOUNTER — Ambulatory Visit
Admission: RE | Admit: 2022-09-10 | Discharge: 2022-09-10 | Disposition: A | Discharge status: Home / Self Care | Payer: Medicare Other | Source: Ambulatory Visit | Attending: Family Medicine | Admitting: Family Medicine

## 2022-09-10 ENCOUNTER — Inpatient Hospital Stay: Admission: RE | Admit: 2022-09-10 | Payer: Medicare Other | Source: Ambulatory Visit

## 2022-09-10 DIAGNOSIS — Z1231 Encounter for screening mammogram for malignant neoplasm of breast: Secondary | ICD-10-CM | POA: Diagnosis not present

## 2022-09-19 DIAGNOSIS — R5383 Other fatigue: Secondary | ICD-10-CM | POA: Diagnosis not present

## 2022-09-20 ENCOUNTER — Encounter: Payer: Self-pay | Admitting: Neurology

## 2022-09-21 DIAGNOSIS — R61 Generalized hyperhidrosis: Secondary | ICD-10-CM | POA: Diagnosis not present

## 2022-09-21 DIAGNOSIS — E785 Hyperlipidemia, unspecified: Secondary | ICD-10-CM | POA: Diagnosis not present

## 2022-09-21 DIAGNOSIS — K219 Gastro-esophageal reflux disease without esophagitis: Secondary | ICD-10-CM | POA: Diagnosis not present

## 2022-09-21 DIAGNOSIS — R454 Irritability and anger: Secondary | ICD-10-CM | POA: Diagnosis not present

## 2022-09-21 DIAGNOSIS — E559 Vitamin D deficiency, unspecified: Secondary | ICD-10-CM | POA: Diagnosis not present

## 2022-09-21 DIAGNOSIS — E876 Hypokalemia: Secondary | ICD-10-CM | POA: Diagnosis not present

## 2022-09-21 DIAGNOSIS — I10 Essential (primary) hypertension: Secondary | ICD-10-CM | POA: Diagnosis not present

## 2022-10-24 ENCOUNTER — Ambulatory Visit: Payer: Medicare Other | Admitting: Neurology

## 2022-12-27 DIAGNOSIS — E7800 Pure hypercholesterolemia, unspecified: Secondary | ICD-10-CM | POA: Diagnosis not present

## 2022-12-27 DIAGNOSIS — N1831 Chronic kidney disease, stage 3a: Secondary | ICD-10-CM | POA: Diagnosis not present

## 2022-12-27 DIAGNOSIS — J309 Allergic rhinitis, unspecified: Secondary | ICD-10-CM | POA: Diagnosis not present

## 2022-12-27 DIAGNOSIS — I129 Hypertensive chronic kidney disease with stage 1 through stage 4 chronic kidney disease, or unspecified chronic kidney disease: Secondary | ICD-10-CM | POA: Diagnosis not present

## 2022-12-27 DIAGNOSIS — R7303 Prediabetes: Secondary | ICD-10-CM | POA: Diagnosis not present

## 2023-02-13 DIAGNOSIS — L290 Pruritus ani: Secondary | ICD-10-CM | POA: Diagnosis not present

## 2023-02-13 DIAGNOSIS — L280 Lichen simplex chronicus: Secondary | ICD-10-CM | POA: Diagnosis not present

## 2023-02-13 DIAGNOSIS — L680 Hirsutism: Secondary | ICD-10-CM | POA: Diagnosis not present

## 2023-06-20 ENCOUNTER — Other Ambulatory Visit: Payer: Self-pay | Admitting: Family Medicine

## 2023-06-20 ENCOUNTER — Encounter: Payer: Self-pay | Admitting: Family Medicine

## 2023-06-20 DIAGNOSIS — N1831 Chronic kidney disease, stage 3a: Secondary | ICD-10-CM | POA: Diagnosis not present

## 2023-06-20 DIAGNOSIS — Z0000 Encounter for general adult medical examination without abnormal findings: Secondary | ICD-10-CM | POA: Diagnosis not present

## 2023-06-20 DIAGNOSIS — H3552 Pigmentary retinal dystrophy: Secondary | ICD-10-CM | POA: Diagnosis not present

## 2023-06-20 DIAGNOSIS — K219 Gastro-esophageal reflux disease without esophagitis: Secondary | ICD-10-CM | POA: Diagnosis not present

## 2023-06-20 DIAGNOSIS — E7800 Pure hypercholesterolemia, unspecified: Secondary | ICD-10-CM | POA: Diagnosis not present

## 2023-06-20 DIAGNOSIS — M85859 Other specified disorders of bone density and structure, unspecified thigh: Secondary | ICD-10-CM

## 2023-06-20 DIAGNOSIS — I129 Hypertensive chronic kidney disease with stage 1 through stage 4 chronic kidney disease, or unspecified chronic kidney disease: Secondary | ICD-10-CM | POA: Diagnosis not present

## 2023-11-20 ENCOUNTER — Other Ambulatory Visit: Payer: Self-pay | Admitting: Neurology

## 2024-01-02 DIAGNOSIS — Z0189 Encounter for other specified special examinations: Secondary | ICD-10-CM | POA: Diagnosis not present

## 2024-01-02 DIAGNOSIS — Z23 Encounter for immunization: Secondary | ICD-10-CM | POA: Diagnosis not present

## 2024-01-02 DIAGNOSIS — Z1159 Encounter for screening for other viral diseases: Secondary | ICD-10-CM | POA: Diagnosis not present

## 2024-01-02 DIAGNOSIS — E785 Hyperlipidemia, unspecified: Secondary | ICD-10-CM | POA: Diagnosis not present

## 2024-01-02 DIAGNOSIS — Z136 Encounter for screening for cardiovascular disorders: Secondary | ICD-10-CM | POA: Diagnosis not present

## 2024-01-02 DIAGNOSIS — Z79899 Other long term (current) drug therapy: Secondary | ICD-10-CM | POA: Diagnosis not present

## 2024-01-02 DIAGNOSIS — Z0001 Encounter for general adult medical examination with abnormal findings: Secondary | ICD-10-CM | POA: Diagnosis not present

## 2024-01-02 DIAGNOSIS — I10 Essential (primary) hypertension: Secondary | ICD-10-CM | POA: Diagnosis not present

## 2024-02-01 ENCOUNTER — Other Ambulatory Visit: Payer: Self-pay | Admitting: Nurse Practitioner

## 2024-02-01 ENCOUNTER — Other Ambulatory Visit: Payer: Self-pay | Admitting: Family Medicine

## 2024-02-01 DIAGNOSIS — Z1231 Encounter for screening mammogram for malignant neoplasm of breast: Secondary | ICD-10-CM

## 2024-02-28 ENCOUNTER — Ambulatory Visit
Admission: RE | Admit: 2024-02-28 | Discharge: 2024-02-28 | Disposition: A | Discharge status: Home / Self Care | Source: Ambulatory Visit | Attending: Family Medicine | Admitting: Family Medicine

## 2024-02-28 ENCOUNTER — Ambulatory Visit
Admission: RE | Admit: 2024-02-28 | Discharge: 2024-02-28 | Disposition: A | Discharge status: Home / Self Care | Payer: Medicare Other | Source: Ambulatory Visit | Attending: Family Medicine | Admitting: Family Medicine

## 2024-02-28 DIAGNOSIS — M85859 Other specified disorders of bone density and structure, unspecified thigh: Secondary | ICD-10-CM

## 2024-02-28 DIAGNOSIS — Z1231 Encounter for screening mammogram for malignant neoplasm of breast: Secondary | ICD-10-CM

## 2024-04-02 ENCOUNTER — Other Ambulatory Visit: Payer: Self-pay | Admitting: Nurse Practitioner

## 2024-04-02 DIAGNOSIS — I70201 Unspecified atherosclerosis of native arteries of extremities, right leg: Secondary | ICD-10-CM

## 2024-04-03 ENCOUNTER — Ambulatory Visit
Admission: RE | Admit: 2024-04-03 | Discharge: 2024-04-03 | Disposition: A | Discharge status: Home / Self Care | Source: Ambulatory Visit | Attending: Nurse Practitioner | Admitting: Nurse Practitioner

## 2024-04-03 ENCOUNTER — Other Ambulatory Visit: Payer: Self-pay | Admitting: Nurse Practitioner

## 2024-04-03 DIAGNOSIS — I70201 Unspecified atherosclerosis of native arteries of extremities, right leg: Secondary | ICD-10-CM

## 2024-04-03 DIAGNOSIS — M79672 Pain in left foot: Secondary | ICD-10-CM

## 2024-04-24 ENCOUNTER — Encounter: Payer: Self-pay | Admitting: Podiatry

## 2024-04-24 ENCOUNTER — Ambulatory Visit (INDEPENDENT_AMBULATORY_CARE_PROVIDER_SITE_OTHER): Admitting: Podiatry

## 2024-04-24 VITALS — Ht 62.0 in | Wt 234.0 lb

## 2024-04-24 DIAGNOSIS — M65972 Unspecified synovitis and tenosynovitis, left ankle and foot: Secondary | ICD-10-CM

## 2024-04-24 MED ORDER — BETAMETHASONE SOD PHOS & ACET 6 (3-3) MG/ML IJ SUSP
3.0000 mg | Freq: Once | INTRAMUSCULAR | Status: AC
Start: 2024-04-24 — End: 2024-04-24
  Administered 2024-04-24: 3 mg via INTRA_ARTICULAR
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
## 2024-10-08 ENCOUNTER — Other Ambulatory Visit: Payer: Self-pay | Admitting: Nurse Practitioner

## 2024-10-08 DIAGNOSIS — R109 Unspecified abdominal pain: Secondary | ICD-10-CM

## 2024-10-15 ENCOUNTER — Inpatient Hospital Stay
Admission: RE | Admit: 2024-10-15 | Discharge: 2024-10-15 | Disposition: A | Source: Ambulatory Visit | Attending: Nurse Practitioner

## 2024-10-15 DIAGNOSIS — R109 Unspecified abdominal pain: Secondary | ICD-10-CM
